# Patient Record
Sex: Female | Born: 1965 | Race: White | Hispanic: No | Marital: Married | Smoking: Former smoker
Health system: Southern US, Community
[De-identification: ages and names within clinical notes are randomized; demographics above are authoritative.]

## PROBLEM LIST (undated history)

## (undated) DIAGNOSIS — M26651 Arthropathy of right temporomandibular joint: Secondary | ICD-10-CM

## (undated) DIAGNOSIS — N809 Endometriosis, unspecified: Secondary | ICD-10-CM

## (undated) DIAGNOSIS — F5102 Adjustment insomnia: Secondary | ICD-10-CM

## (undated) DIAGNOSIS — G250 Essential tremor: Secondary | ICD-10-CM

## (undated) DIAGNOSIS — R079 Chest pain, unspecified: Secondary | ICD-10-CM

## (undated) DIAGNOSIS — K219 Gastro-esophageal reflux disease without esophagitis: Secondary | ICD-10-CM

## (undated) DIAGNOSIS — F32A Depression, unspecified: Secondary | ICD-10-CM

## (undated) DIAGNOSIS — R41840 Attention and concentration deficit: Secondary | ICD-10-CM

## (undated) DIAGNOSIS — R0789 Other chest pain: Secondary | ICD-10-CM

## (undated) DIAGNOSIS — E079 Disorder of thyroid, unspecified: Secondary | ICD-10-CM

## (undated) DIAGNOSIS — E28319 Asymptomatic premature menopause: Secondary | ICD-10-CM

## (undated) DIAGNOSIS — E049 Nontoxic goiter, unspecified: Secondary | ICD-10-CM

## (undated) DIAGNOSIS — B001 Herpesviral vesicular dermatitis: Secondary | ICD-10-CM

## (undated) DIAGNOSIS — K5790 Diverticulosis of intestine, part unspecified, without perforation or abscess without bleeding: Secondary | ICD-10-CM

## (undated) DIAGNOSIS — M1990 Unspecified osteoarthritis, unspecified site: Secondary | ICD-10-CM

## (undated) DIAGNOSIS — F329 Major depressive disorder, single episode, unspecified: Secondary | ICD-10-CM

## (undated) HISTORY — DX: Essential tremor: G25.0

## (undated) HISTORY — DX: Disorder of thyroid, unspecified: E07.9

## (undated) HISTORY — DX: Nontoxic goiter, unspecified: E04.9

## (undated) HISTORY — DX: Major depressive disorder, single episode, unspecified: F32.9

## (undated) HISTORY — DX: Endometriosis, unspecified: N80.9

## (undated) HISTORY — DX: Depression, unspecified: F32.A

## (undated) HISTORY — DX: Chest pain, unspecified: R07.9

## (undated) HISTORY — DX: Arthropathy of right temporomandibular joint: M26.651

## (undated) HISTORY — DX: Asymptomatic premature menopause: E28.319

## (undated) HISTORY — DX: Unspecified osteoarthritis, unspecified site: M19.90

## (undated) HISTORY — DX: Diverticulosis of intestine, part unspecified, without perforation or abscess without bleeding: K57.90

## (undated) HISTORY — PX: OTHER SURGICAL HISTORY: SHX169

## (undated) HISTORY — PX: PELVIC LAPAROSCOPY: SHX162

## (undated) HISTORY — PX: TONSILLECTOMY AND ADENOIDECTOMY: SUR1326

## (undated) HISTORY — DX: Other chest pain: R07.89

## (undated) HISTORY — DX: Attention and concentration deficit: R41.840

## (undated) HISTORY — DX: Adjustment insomnia: F51.02

## (undated) HISTORY — DX: Herpesviral vesicular dermatitis: B00.1

## (undated) HISTORY — DX: Gastro-esophageal reflux disease without esophagitis: K21.9

---

## 1999-10-19 ENCOUNTER — Other Ambulatory Visit: Admission: RE | Admit: 1999-10-19 | Discharge: 1999-10-19 | Payer: Self-pay | Admitting: Gynecology

## 2001-03-26 ENCOUNTER — Other Ambulatory Visit: Admission: RE | Admit: 2001-03-26 | Discharge: 2001-03-26 | Payer: Self-pay | Admitting: Gynecology

## 2001-04-15 ENCOUNTER — Ambulatory Visit (HOSPITAL_COMMUNITY): Admission: RE | Admit: 2001-04-15 | Discharge: 2001-04-15 | Payer: Self-pay | Admitting: Gynecology

## 2001-04-15 ENCOUNTER — Encounter (INDEPENDENT_AMBULATORY_CARE_PROVIDER_SITE_OTHER): Payer: Self-pay | Admitting: Specialist

## 2003-01-24 ENCOUNTER — Inpatient Hospital Stay (HOSPITAL_COMMUNITY): Admission: RE | Admit: 2003-01-24 | Discharge: 2003-01-24 | Payer: Self-pay | Admitting: *Deleted

## 2004-02-15 ENCOUNTER — Other Ambulatory Visit: Admission: RE | Admit: 2004-02-15 | Discharge: 2004-02-15 | Payer: Self-pay | Admitting: Gynecology

## 2005-05-02 ENCOUNTER — Other Ambulatory Visit: Admission: RE | Admit: 2005-05-02 | Discharge: 2005-05-02 | Payer: Self-pay | Admitting: Gynecology

## 2006-07-12 ENCOUNTER — Other Ambulatory Visit: Admission: RE | Admit: 2006-07-12 | Discharge: 2006-07-12 | Payer: Self-pay | Admitting: Gynecology

## 2007-08-12 ENCOUNTER — Other Ambulatory Visit: Admission: RE | Admit: 2007-08-12 | Discharge: 2007-08-12 | Payer: Self-pay | Admitting: Gynecology

## 2008-08-21 ENCOUNTER — Other Ambulatory Visit: Admission: RE | Admit: 2008-08-21 | Discharge: 2008-08-21 | Payer: Self-pay | Admitting: Gynecology

## 2008-08-21 ENCOUNTER — Ambulatory Visit: Payer: Self-pay | Admitting: Gynecology

## 2008-08-21 ENCOUNTER — Encounter: Payer: Self-pay | Admitting: Gynecology

## 2008-08-26 ENCOUNTER — Ambulatory Visit: Payer: Self-pay | Admitting: Gynecology

## 2009-04-26 ENCOUNTER — Ambulatory Visit: Payer: Self-pay | Admitting: Gynecology

## 2009-04-27 ENCOUNTER — Ambulatory Visit: Payer: Self-pay | Admitting: Gynecology

## 2009-09-15 ENCOUNTER — Encounter: Payer: Self-pay | Admitting: Gynecology

## 2009-09-15 ENCOUNTER — Other Ambulatory Visit: Admission: RE | Admit: 2009-09-15 | Discharge: 2009-09-15 | Payer: Self-pay | Admitting: Gynecology

## 2009-09-15 ENCOUNTER — Ambulatory Visit: Payer: Self-pay | Admitting: Gynecology

## 2010-07-04 HISTORY — PX: CHOLECYSTECTOMY: SHX55

## 2010-12-09 ENCOUNTER — Other Ambulatory Visit
Admission: RE | Admit: 2010-12-09 | Discharge: 2010-12-09 | Payer: Self-pay | Source: Home / Self Care | Admitting: Gynecology

## 2010-12-09 ENCOUNTER — Ambulatory Visit
Admission: RE | Admit: 2010-12-09 | Discharge: 2010-12-09 | Payer: Self-pay | Source: Home / Self Care | Attending: Gynecology | Admitting: Gynecology

## 2011-04-05 ENCOUNTER — Other Ambulatory Visit: Payer: Commercial Indemnity

## 2011-04-05 ENCOUNTER — Ambulatory Visit (INDEPENDENT_AMBULATORY_CARE_PROVIDER_SITE_OTHER): Payer: Commercial Indemnity | Admitting: Gynecology

## 2011-04-05 ENCOUNTER — Other Ambulatory Visit: Payer: Self-pay | Admitting: Gynecology

## 2011-04-05 DIAGNOSIS — N882 Stricture and stenosis of cervix uteri: Secondary | ICD-10-CM

## 2011-04-05 DIAGNOSIS — D259 Leiomyoma of uterus, unspecified: Secondary | ICD-10-CM

## 2011-04-05 DIAGNOSIS — N950 Postmenopausal bleeding: Secondary | ICD-10-CM

## 2011-04-21 NOTE — Op Note (Signed)
[facility]  Patient:    Michele Roberson, Michele Roberson                  MRN: [id number] Proc. Date: 04/15/01 Adm. Date:  [id number] Attending:  Merrily Pew                           Operative Report  PREOPERATIVE DIAGNOSIS:       Endometriosis.  POSTOPERATIVE DIAGNOSIS:      Endometriosis.  PROCEDURES:                   1. Laparoscopic biopsy and fulguration of                                  endometriosis.                               2. Left ovarian endometrioma excision and                                  fulguration.                               3. Chromopertubation.  SURGEON:                      Timothy P. Fontaine, M.D.  ANESTHESIA:                   General endotracheal.  COMPLICATIONS:                None.  ESTIMATED BLOOD LOSS:         Minimal.  SPECIMENS:                    1. Peritoneal biopsy.                               2. Ovarian endometrioma cyst wall.  FINDINGS:                     The anterior cul-de-sac with superficial brown endometriosis.  A representative area was biopsied and bipolar fulgurated. The posterior cul-de-sac was grossly normal with changes consistent with her past history of LUNA.  Uterus normal size.  Right and left fallopian tubes normal length, caliber and normal fimbriated ends.  Positive fill and spill. The right ovary was grossly normal, free and mobile.  The left ovary with puckered cystic area upon entry with chocolate cyst material.  Representative cyst wall biopsy taken and the remainder fulgurated.  Left pelvic side wall fibrotic area underlying the ovary above ureter and vascular structures bipolar cauterized.  No other areas of active-appearing endometriosis. Appendix grossly normal.  Liver smooth and grossly normal.  Gallbladder not visualized.  DESCRIPTION OF PROCEDURE:     The patient was taken to the operating room and underwent general endotracheal anesthesia, received an  abdominal, peritoneal and vaginal preparation with Betadine scrub and Betadine solution.  The bladder was emptied with in-and-out Foley catheterization.  Examination under anesthesia was performed.  A Hulka tenaculum was placed in the cervix.  The patient was draped  in the usual fashion.  A transverse infraumbilical incision was made through the prior scar.  A Veress needle was placed and its position verified with water.  Approximately 2 L of carbon dioxide gas was infused without difficulty.  A 10 mm laparoscopic trocar was then placed without difficulty and its position verified visually.  A midline 5 mm suprapubic port was placed under direct visualization after transillumination for the vessels. Initial examination of the pelvic organs and upper abdominal exam were then carried out with findings as noted above.  A left of midline 5 mm port was subsequently placed in a similar fashion as the first port.  The anterior peritoneal cul-de-sac endometriosis was grasped with a forceps, the peritoneum tented and a representative biopsy taken.  The area was then superficially bipolar cauterized in a brushing technique.  The left ovary was then elevated and stabilized.  Sharp incision into a cystic area produced a chocolate-brown endometriotic cyst fluid.  This was copiously irrigated from the pelvis, as was the cyst, which was approximately 2 cm.  Subsequently, the cyst wall was sharply incised.  Initial attempts at stripping the cyst from the ovarian stroma were unsuccessful and a representative area was excised.  Subsequently, with bipolar cauterization, the remaining endometriotic cyst wall was fulgurated.  An underlying fibrotic area in the left pelvic side wall was identified and, although it was difficult to tell whether this was active endometriosis or not, this area was bipolar fulgurated, taking care to identify the ureter and vessels far from the site.  The pelvis was  copiously irrigated.  The Hulka tenaculum was replaced with a Cohen cannula and chromopertubation was performed, noting free fill and spill of dye from both tubes.  The left ovary was then elevated and INTERCEED was placed over the area of the endometrioma without difficulty.  The suprapubic ports were then removed and the gas allowed to escape.  The areas were inspected under low pressure situation to show adequate hemostasis.  The infraumbilical port was back out under direct visualization, showing adequate hemostasis and no evidence of hernia formation.  All skin incision sites were injected using 0.25% Marcaine subcutaneously.  The infraumbilical port was closed using 0 Vicryl suture in an interrupted subcutaneous fascial stitch.  All skin incisions were then closed using 4-0 Vicryl in interrupted subcuticular stitch.  Sterile dressings were applied.  The tenaculum was removed from the cervix.  The patient was placed in the supine position, awakened without difficulty and taken to the recovery room in good condition, having tolerated the procedure well. DD:  04/15/01 TD:  04/15/01 Job: [id number]

## 2011-04-21 NOTE — H&P (Signed)
[facility]  Patient:    Michele Roberson, Michele Roberson                    MRN: [id number] Adm. Date:  04/15/01 Attending:  Marcial Pacas P. Fontaine, M.D.                         History and Physical  CHIEF COMPLAINT:              Increasing dysmenorrhea, pelvic pain, dyspareunia, history of endometriosis.  HISTORY OF PRESENT ILLNESS:   This is a 45 year old G0, P0 female who presents with a history of having undergone laparoscopy in 1996, at which time endometriosis of the pelvic sidewall, left ovary, anterior peritoneum was found.  She underwent laser vaporization and a lunar procedure at that time, and she relates that the pain that she was having preoperatively improved postoperatively.  The patient notes now that her pain has reappeared over the past year and she is now having worsening dysmenorrhea, pelvic pain, and dyspareunia similar to the pain she was having prior to her last laparoscopy. She has also been attempting pregnancy and has been unsuccessful, despite undergoing evaluation by Dr. Jacky Kindle to include a Clomid/Pergonal cycle with IUI x 2 cycles.  The patient is admitted at this time for laser video laparoscopy _________ per tubation.  PAST MEDICAL HISTORY:         Uncomplicated.  PAST SURGICAL HISTORY:        Tonsillectomy and adenoidectomy, left wrist cyst, and laparoscopy.  ALLERGIES:                    E-MYCIN.  FAMILY HISTORY:               Noncontributory.  SOCIAL HISTORY:               Noncontributory without alcohol or cigarette use.  REVIEW OF SYSTEMS:            Noncontributory.  ADMISSION PHYSICAL EXAMINATION:  HEENT:                        Normal.  LUNGS:                        Clear.  CARDIAC:                      Regular rate without rubs, murmurs, or gallops.  ABDOMEN:                      Benign.  PELVIC:                       External BUS, vagina normal.  Cervix normal. Uterus normal size, nontender.  Adnexae  without masses or tenderness.  ASSESSMENT:                   A 45 year old G0, P0 female, history of endometriosis, status post laser vaporization and lunar procedure in 1996 with recurrence of her symptoms.  The patient also has primary infertility which she has been evaluated for and treated with two cycles of Clomid/Pergonal and IUI without success.  The patient is admitted at this time for relaparoscopy _________ per tubation.  I again reviewed with the patient what is involved to include instrumentation, trocar placement, insufflation, use of sharp/blunt dissection, electrocautery, and  laser.  The risks of the procedure were reviewed with the patient to include the risk of inadvertent injury to internal organs including bowel, bladder, ureters, vessels, and nerves, necessitating major exploratory reparative surgeries and future reparative surgeries including ostomy formation.  The risks of hemorrhage, necessitating transfusion and the risks of transfusion were discussed, including the risks of transfusion reaction, hepatitis, AIDs, mad cow disease, and other unknown entities.  The risks of infection, both incisional, requiring opening and draining of incisions, as well as internal, requiring prolonged antibiotics, were all discussed, understood, and accepted.  Given her history of infertility, I think that we should also do a _________ per tubation at that time and I have discussed this with her and she agrees with the procedure. Lastly, no guarantees as far as pain relief, nor infertility were made.  The patient understands that she may have persistent pain, worsening pain, changing pain following the procedure and that she may still be unable to conceive following the procedure, and she understands and accepts this.  The patients questions were answered to her satisfaction and she is ready to proceed with surgery. DD:  04/11/01 TD:  04/11/01 Job: [id number]

## 2012-03-20 ENCOUNTER — Encounter: Payer: Self-pay | Admitting: *Deleted

## 2012-03-20 DIAGNOSIS — E28319 Asymptomatic premature menopause: Secondary | ICD-10-CM | POA: Insufficient documentation

## 2012-03-21 ENCOUNTER — Ambulatory Visit (INDEPENDENT_AMBULATORY_CARE_PROVIDER_SITE_OTHER): Payer: Commercial Indemnity | Admitting: Gynecology

## 2012-03-21 ENCOUNTER — Encounter: Payer: Self-pay | Admitting: Gynecology

## 2012-03-21 VITALS — BP 100/60 | Ht 63.5 in | Wt 140.0 lb

## 2012-03-21 DIAGNOSIS — F329 Major depressive disorder, single episode, unspecified: Secondary | ICD-10-CM | POA: Insufficient documentation

## 2012-03-21 DIAGNOSIS — Z79890 Hormone replacement therapy (postmenopausal): Secondary | ICD-10-CM

## 2012-03-21 DIAGNOSIS — Z131 Encounter for screening for diabetes mellitus: Secondary | ICD-10-CM

## 2012-03-21 DIAGNOSIS — Z13220 Encounter for screening for lipoid disorders: Secondary | ICD-10-CM

## 2012-03-21 DIAGNOSIS — Z01419 Encounter for gynecological examination (general) (routine) without abnormal findings: Secondary | ICD-10-CM

## 2012-03-21 MED ORDER — ESTROPIPATE 1.5 MG PO TABS: 1.5000 mg | ORAL_TABLET | Freq: Every day | ORAL | Status: DC

## 2012-03-21 MED ORDER — PROGESTERONE MICRONIZED 100 MG PO CAPS: 100.0000 mg | ORAL_CAPSULE | Freq: Every day | ORAL | Status: DC

## 2012-03-21 NOTE — Patient Instructions (Signed)
Follow up in a year, sooner as needed.

## 2012-03-21 NOTE — Progress Notes (Signed)
Michele Roberson 03-15-1966 [id number]        46 y.o.  for annual exam.  Several issues noted below.  Past medical history,surgical history, medications, allergies, family history and social history were all reviewed and documented in the EPIC chart. ROS:  Was performed and pertinent positives and negatives are included in the history.  Exam: Amy chaperone present Filed Vitals:   03/21/12 1132  BP: 100/60   General appearance  Normal Skin grossly normal Head/Neck normal with no cervical or supraclavicular adenopathy thyroid normal Lungs  clear Cardiac RR, without RMG Abdominal  soft, nontender, without masses, organomegaly or hernia Breasts  examined lying and sitting without masses, retractions, discharge or axillary adenopathy. Pelvic  Ext/BUS/vagina  normal   Cervix  normal    Uterus  anteverted, normal size, shape and contour, midline and mobile nontender   Adnexa  Without masses or tenderness    Anus and perineum  normal   Rectovaginal  normal sphincter tone without palpated masses or tenderness.    Assessment/Plan:  46 y.o. female for annual exam.    1. Premature menopause. Patient had been on Estrace/Prometrium. She had discussed just not feeling as well with her primary physician who gave her a trial of bioidentical hormones. She did not feel any different with these and she wants to reinitiate regular HRT. She did ask about trying Ogen again which she had been on in the past and did well with this. We have discussed the HRT issue on multiple occasions to include the WHI study of stroke heart attack DVT breast cancer issues. Patient understands accepts these I refilled her HRT with Ogen 1.25 daily and Prometrium 100 mg nightly. She'll follow up with me if she has any issues with this. 2. Mammography. Patient is overdue for her mammogram and have reminded her to schedule this. SBE monthly reviewed. 3. Pap smear. She has no history of abnormal Pap smears with numerous normal  reports in the chart, last Pap smear 2012. No Pap smear was done today. We'll plan every 3 your Pap smear as per current screening guidelines. 4. Health maintenance. No blood work was done today. She sees her primary physician as well as endocrinologist and has routine blood work done through their office to include cholesterol glucose etc. I did check a urinalysis as historically she did not have this checked. Assuming she continues well from a gynecologic standpoint and she will see [place] in a year, sooner as needed.    Dara Lords MD, 12:17 PM 03/21/2012

## 2012-03-22 LAB — URINALYSIS W MICROSCOPIC + REFLEX CULTURE
Bacteria, UA: NONE SEEN
Bilirubin Urine: NEGATIVE
Casts: NONE SEEN
Crystals: NONE SEEN
Ketones, ur: NEGATIVE mg/dL
Specific Gravity, Urine: 1.013 (ref 1.005–1.030)
pH: 6 (ref 5.0–8.0)

## 2012-06-21 ENCOUNTER — Other Ambulatory Visit: Payer: Self-pay | Admitting: Gynecology

## 2012-12-26 ENCOUNTER — Telehealth: Payer: Self-pay | Admitting: *Deleted

## 2012-12-26 NOTE — Telephone Encounter (Signed)
Pt called c/o irregular bleeding, she states last cycle was a couple of years ago. Prior to on  Dec. 20th she had a full cycle, then stopped had spotting. Then another cycle started back on Monday regular flow, she still taking her progesterone as directed doesn't miss any days. Please advise

## 2012-12-26 NOTE — Telephone Encounter (Signed)
Patient needs evaluation. Recommend sonohysterogram and office visit to start.

## 2012-12-26 NOTE — Telephone Encounter (Signed)
Pt informed with the below note. Transferred to appointment desk.

## 2013-01-01 ENCOUNTER — Other Ambulatory Visit: Payer: Self-pay | Admitting: Gynecology

## 2013-01-01 DIAGNOSIS — N950 Postmenopausal bleeding: Secondary | ICD-10-CM

## 2013-01-06 ENCOUNTER — Ambulatory Visit (INDEPENDENT_AMBULATORY_CARE_PROVIDER_SITE_OTHER): Payer: Commercial Indemnity | Admitting: Gynecology

## 2013-01-06 ENCOUNTER — Encounter: Payer: Self-pay | Admitting: Gynecology

## 2013-01-06 ENCOUNTER — Other Ambulatory Visit: Payer: Self-pay | Admitting: Gynecology

## 2013-01-06 ENCOUNTER — Ambulatory Visit (INDEPENDENT_AMBULATORY_CARE_PROVIDER_SITE_OTHER): Payer: Commercial Indemnity

## 2013-01-06 DIAGNOSIS — N950 Postmenopausal bleeding: Secondary | ICD-10-CM

## 2013-01-06 DIAGNOSIS — N800 Endometriosis of uterus: Secondary | ICD-10-CM

## 2013-01-06 DIAGNOSIS — N83339 Acquired atrophy of ovary and fallopian tube, unspecified side: Secondary | ICD-10-CM

## 2013-01-06 DIAGNOSIS — Z79890 Hormone replacement therapy (postmenopausal): Secondary | ICD-10-CM

## 2013-01-06 DIAGNOSIS — N926 Irregular menstruation, unspecified: Secondary | ICD-10-CM

## 2013-01-06 DIAGNOSIS — N8300 Follicular cyst of ovary, unspecified side: Secondary | ICD-10-CM

## 2013-01-06 NOTE — Progress Notes (Signed)
Patient presents for sonohysterogram due to history of spontaneous vaginal bleeding several episodes over the past several months. She is on HRT of Ogen 1.5 mg and Prometrium 100 mg daily. She has a history of premature menopause. Notes that she feels almost premenopausal when she would have this bleeding episode. Patient also notes dyspareunia with pain upon entry. Thinks this is due to low estrogen and vaginal atrophic changes. We have discussed this in the past.  Exam external BUS vagina normal noting vaginismus type pattern with tightening on exam causing pain. Cervix grossly normal. Uterus normal size midline mobile nontender. Adnexa without masses or tenderness.  Ultrasound shows uterus normal size with some cortical cystic areas in the myometrium. Endometrial echo 3.5 mm. Right and left ovaries visualized and atrophic. Cul-de-sac negative.  sonohysterogram performed, sterile technique, easy catheter introduction, good distention with no abnormalities. Endometrial sample taken. Procedure did cause patient a lot of discomfort which she tolerated well.  Assessment and plan: 1. Premature menopause with sporadic bleeding of the last several months on HRT. Gives moliminal type symptoms before. We'll check baseline labs to include FSH TSH and prolactin. Continue on HRT for now and follow up for biopsy results. I suspect these will return atrophic or insufficient which is appropriate given the thinness on her ultrasound. 2. Vaginismus type pattern. I reviewed strategies to include self dilatation to achieve comfort and then allowing her husband to be involved but not coitally but only for placement until she feels comfortable with proceeding. Using graded dilators reviewed. She does want to try estrogen boost to the vagina the see if this doesn't help I gave her 6 week sample of Vagifem 10 mcg*to use it twice weekly to see if this doesn't help.patient will follow up for her labs and biopsy results and then  ultimately call me in 4-6 weeks in follow up of the Vagifem trial.

## 2013-01-06 NOTE — Patient Instructions (Signed)
Office will call you with biopsy results and lab results

## 2013-01-10 ENCOUNTER — Encounter: Payer: Self-pay | Admitting: Gynecology

## 2013-01-16 ENCOUNTER — Encounter: Payer: Self-pay | Admitting: Gynecology

## 2013-01-18 ENCOUNTER — Other Ambulatory Visit: Payer: Self-pay

## 2013-01-20 ENCOUNTER — Telehealth: Payer: Self-pay | Admitting: *Deleted

## 2013-01-20 DIAGNOSIS — N926 Irregular menstruation, unspecified: Secondary | ICD-10-CM

## 2013-01-20 NOTE — Telephone Encounter (Signed)
I called pt to follow up with email regarding her not being able to see Lab results. Pts labs went to [facility]. So pt will not be able to see them on mychart. I printed them and mailed them but upon doing that I noticed that the [facility] wasn't ran. I called [facility] and they said it was data entry error, order was on requistion but not ran. Pt informed and will come to get that drawn. Order placed [employer]

## 2013-01-20 NOTE — Addendum Note (Signed)
Addended by: Richardson Chiquito on: 01/20/2013 03:00 PM   Modules accepted: Orders

## 2013-10-09 ENCOUNTER — Other Ambulatory Visit: Payer: Self-pay

## 2014-09-18 ENCOUNTER — Other Ambulatory Visit: Payer: Self-pay

## 2017-09-18 DIAGNOSIS — R0781 Pleurodynia: Secondary | ICD-10-CM | POA: Insufficient documentation

## 2017-09-18 DIAGNOSIS — R079 Chest pain, unspecified: Secondary | ICD-10-CM

## 2017-09-18 DIAGNOSIS — K5790 Diverticulosis of intestine, part unspecified, without perforation or abscess without bleeding: Secondary | ICD-10-CM

## 2017-09-18 DIAGNOSIS — M1990 Unspecified osteoarthritis, unspecified site: Secondary | ICD-10-CM

## 2017-09-18 DIAGNOSIS — E079 Disorder of thyroid, unspecified: Secondary | ICD-10-CM | POA: Insufficient documentation

## 2017-09-18 DIAGNOSIS — N809 Endometriosis, unspecified: Secondary | ICD-10-CM | POA: Insufficient documentation

## 2017-09-18 HISTORY — DX: Chest pain, unspecified: R07.9

## 2017-09-18 HISTORY — DX: Diverticulosis of intestine, part unspecified, without perforation or abscess without bleeding: K57.90

## 2017-09-18 HISTORY — DX: Unspecified osteoarthritis, unspecified site: M19.90

## 2017-09-18 NOTE — Progress Notes (Signed)
Cardiology Office Note:    Date:  09/19/2017   ID:  Michele Roberson, DOB [birth year], MRN [id number]  PCP:  Ernestene Kiel, MD  Cardiologist:  Shirlee More, MD   Referring MD: Ernestene Kiel, MD  ASSESSMENT:    1. Pleuritic chest pain   2. SOB (shortness of breath) on exertion   3. Costochondral chest pain    PLAN:    In order of problems listed above:  1. Chest pain is most consistent with costochondral etiology, I've asked her to have a stress echo to exclude CAD with an abnormal EKG and use prescription strength nonsteroidal anti-inflammatory drug and if unimproved PT modalities. 2. Mild non limiting, check d-dimer if normal I would not pursue further evaluation for venous thromboembolism as she has not taken hormone replacement therapy for several years. 3. Prescription strength nonsteroidal anti-inflammatory drug reassess in several weeks for referral to physical therapy  Next appointment in 2 weeks  Medication Adjustments/Labs and Tests Ordered: Current medicines are reviewed at length with the patient today.  Concerns regarding medicines are outlined above.  Orders Placed This Encounter  Procedures  . CT ANGIO CHEST PE W OR WO CONTRAST  . Sedimentation rate  . ECHOCARDIOGRAM STRESS TEST   Meds ordered this encounter  Medications  . celecoxib (CELEBREX) 200 MG capsule    Sig: Take 1 capsule (200 mg total) by mouth 2 (two) times daily.    Dispense:  60 capsule    Refill:  1     Chief Complaint  Patient presents with  . Follow-up  . Chest Pain  . Shortness of Breath    History of Present Illness:    Michele Roberson is a 51 y.o. female  On hormone replacement therapy who is being seen today for the evaluation of chest pain and an abnormal EKG at the request of Ernestene Kiel, MD. She was seen at [facility] ED 09/13/17 with pleuritic chest pain since pneumonia in August, CXR, EKG, CBC and Troponin were all normal. She was told that her EKG was  abnormal when seen at Urgent care earlier that day. She has been off hormone replacement therapy for several years and has no history of venous thromboembolism. 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 She has been using a nonsteroidal anti-inflammatory drug without improvement..She has mild exertional shortness of breath walking a long distance but no severe or paroxysmal episodes. She relates that hr first EKG recently was abnormal and was encouraged to see a cardiologist by the physician at urgent care.  Past Medical History:  Diagnosis Date  . Arthritis 09/18/2017  . Chest pain 09/18/2017  . Depression   . Diverticulosis 09/18/2017  . Endometriosis   . Endometriosis   . Premature menopause   . Thyroid disease     Past Surgical History:  Procedure Laterality Date  . CHOLECYSTECTOMY  07/2010  . PELVIC LAPAROSCOPY     x2  . TONSILLECTOMY AND ADENOIDECTOMY    . wrist cyst removal      Current Medications: Current Meds  Medication Sig  . buPROPion (WELLBUTRIN XL) 300 MG 24 hr tablet Take 300 mg by mouth daily.  Marland Kitchen escitalopram (LEXAPRO) 10 MG tablet Take 10 mg by mouth daily.  Marland Kitchen L-Methylfolate (DEPLIN PO) Take by mouth.  Marland Kitchen omeprazole (PRILOSEC) 20 MG capsule Take by mouth daily.  Marland Kitchen  temazepam (RESTORIL) 15 MG capsule Take 15 mg by mouth at bedtime as needed.  . traZODone (DESYREL) 100 MG tablet Take 100 mg by mouth at bedtime.     Allergies:   Erythromycin   Social History   Social History  . Marital status: Married    Spouse name: N/A  . Number of children: N/A  . Years of education: N/A   Social History Main Topics  . Smoking status: Former Research scientist (life sciences)  . Smokeless tobacco: Never Used  . Alcohol use No  . Drug use: No  . Sexual activity: Yes    Partners: Male    Birth control/ protection:  Post-menopausal   Other Topics Concern  . None   Social History Narrative  . None     Family History: The patient's family history includes Diabetes in her father; Heart disease in her father; Hypertension in her father; Uterine cancer in her paternal grandmother.  ROS:   ROS Please see the history of present illness.     All other systems reviewed and are negative.  EKGs/Labs/Other Studies Reviewed:    The following studies were reviewed today:   EKG:  EKG is  ordered today.  The ekg ordered today demonstrates sinus  Sinus rhythm and nonspecificT wave  Recent Labs: No results found for requested labs within last 8760 hours.  Recent Lipid Panel No results found for: CHOL, TRIG, HDL, CHOLHDL, VLDL, LDLCALC, LDLDIRECT  Physical Exam:    VS:  BP 114/74 (BP Location: Right Arm, Patient Position: Sitting, Cuff Size: Normal)   Ht 5' 3.5" (1.613 m)   Wt 160 lb (72.6 kg)   SpO2 97%   BMI 27.90 kg/m     Wt Readings from Last 3 Encounters:  09/19/17 160 lb (72.6 kg)  03/21/12 140 lb (63.5 kg)     GEN:  Well nourished, well developed in no acute distress HEENT: Normal NECK: No JVD; No carotid bruits LYMPHATICS: No lymphadenopathy CARDIAC: tenderness along the left costochondral junction reproducing her complaint RRR, no murmurs, rubs, gallops RESPIRATORY:  Clear to auscultation without rales, wheezing or rhonchi  ABDOMEN: Soft, non-tender, non-distended MUSCULOSKELETAL:  No edema; No deformity  SKIN: Warm and dry NEUROLOGIC:  Alert and oriented x 3 PSYCHIATRIC:  Normal affect     Signed, Shirlee More, MD  09/19/2017 1:12 PM    [facility]

## 2017-09-19 ENCOUNTER — Encounter: Payer: Self-pay | Admitting: Cardiology

## 2017-09-19 ENCOUNTER — Ambulatory Visit (INDEPENDENT_AMBULATORY_CARE_PROVIDER_SITE_OTHER): Payer: Managed Care, Other (non HMO) | Admitting: Cardiology

## 2017-09-19 VITALS — BP 114/74 | Ht 63.5 in | Wt 160.0 lb

## 2017-09-19 DIAGNOSIS — R071 Chest pain on breathing: Secondary | ICD-10-CM | POA: Diagnosis not present

## 2017-09-19 DIAGNOSIS — R0789 Other chest pain: Secondary | ICD-10-CM | POA: Insufficient documentation

## 2017-09-19 DIAGNOSIS — R0602 Shortness of breath: Secondary | ICD-10-CM | POA: Diagnosis not present

## 2017-09-19 DIAGNOSIS — R0781 Pleurodynia: Secondary | ICD-10-CM | POA: Diagnosis not present

## 2017-09-19 MED ORDER — CELECOXIB 200 MG PO CAPS: 200.0000 mg | ORAL_CAPSULE | Freq: Two times a day (BID) | ORAL | 1 refills | Status: DC

## 2017-09-19 NOTE — Patient Instructions (Addendum)
Medication Instructions:  Your physician has recommended you make the following change in your medication:  STOP naproxen START celecoxib (Celebrex) 200 mg twice daily  Labwork: Your physician recommends that you return for lab work today. Sed rate  Testing/Procedures: Non-Cardiac CT Angiography (CTA), is a special type of CT scan that uses a computer to produce multi-dimensional views of major blood vessels throughout the body. In CT angiography, a contrast material is injected through an IV to help visualize the blood vessels  Your physician has requested that you have a stress echocardiogram. For further information please visit [email or web address]. Please follow instruction sheet as given.  Follow-Up: Your physician recommends that you schedule a follow-up appointment in: 2 weeks.  Any Other Special Instructions Will Be Listed Below (If Applicable).     If you need a refill on your cardiac medications before your next appointment, please call your pharmacy.    Costochondritis Costochondritis is swelling and irritation (inflammation) of the tissue (cartilage) that connects your ribs to your breastbone (sternum). This causes pain in the front of your chest. Usually, the pain:  Starts gradually.  Is in more than one rib.  This condition usually goes away on its own over time. Follow these instructions at home:  Do not do anything that makes your pain worse.  If directed, put ice on the painful area: ? Put ice in a plastic bag. ? Place a towel between your skin and the bag. ? Leave the ice on for 20 minutes, 2-3 times a day.  If directed, put heat on the affected area as often as told by your doctor. Use the heat source that your doctor tells you to use, such as a moist heat pack or a heating pad. ? Place a towel between your skin and the heat source. ? Leave the heat on for 20-30 minutes. ? Take off the heat if your skin turns bright red. This is very important if you  cannot feel pain, heat, or cold. You may have a greater risk of getting burned.  Take over-the-counter and prescription medicines only as told by your doctor.  Return to your normal activities as told by your doctor. Ask your doctor what activities are safe for you.  Keep all follow-up visits as told by your doctor. This is important. Contact a doctor if:  You have chills or a fever.  Your pain does not go away or it gets worse.  You have a cough that does not go away. Get help right away if:  You are short of breath. This information is not intended to replace advice given to you by your health care provider. Make sure you discuss any questions you have with your health care provider. Document Released: 05/08/2008 Document Revised: 06/09/2016 Document Reviewed: 03/15/2016 Elsevier Interactive Patient Education  [employer].

## 2017-09-20 LAB — SEDIMENTATION RATE: SED RATE: 3 mm/h (ref 0–40)

## 2017-09-20 NOTE — Addendum Note (Signed)
Addended by: Jossie Ng on: 09/20/2017 08:36 AM   Modules accepted: Orders

## 2017-09-25 ENCOUNTER — Ambulatory Visit (HOSPITAL_BASED_OUTPATIENT_CLINIC_OR_DEPARTMENT_OTHER)
Admission: RE | Admit: 2017-09-25 | Discharge: 2017-09-25 | Disposition: A | Discharge status: Home / Self Care | Payer: Managed Care, Other (non HMO) | Source: Ambulatory Visit | Attending: Cardiology | Admitting: Cardiology

## 2017-09-25 ENCOUNTER — Encounter (HOSPITAL_BASED_OUTPATIENT_CLINIC_OR_DEPARTMENT_OTHER): Payer: Self-pay

## 2017-09-25 DIAGNOSIS — Z8781 Personal history of (healed) traumatic fracture: Secondary | ICD-10-CM | POA: Insufficient documentation

## 2017-09-25 DIAGNOSIS — R0781 Pleurodynia: Secondary | ICD-10-CM

## 2017-09-25 MED ORDER — IOPAMIDOL (ISOVUE-370) INJECTION 76%
100.0000 mL | Freq: Once | INTRAVENOUS | Status: AC | PRN
  Administered 2017-09-25: 100 mL via INTRAVENOUS

## 2017-09-25 NOTE — Progress Notes (Signed)
  Echocardiogram Echocardiogram Stress Test has been performed.  Tresa Res 09/25/2017, 11:53 AM

## 2017-10-02 NOTE — Progress Notes (Deleted)
Cardiology Office Note:    Date:  10/02/2017   ID:  REA Michele Roberson, DOB [birth year], MRN [id number]  PCP:  Ernestene Kiel, MD  Cardiologist:  Shirlee More, MD    Referring MD: Ernestene Kiel, MD    ASSESSMENT:    No diagnosis found. PLAN:    In order of problems listed above:  1. ***   Next appointment: ***   Medication Adjustments/Labs and Tests Ordered: Current medicines are reviewed at length with the patient today.  Concerns regarding medicines are outlined above.  No orders of the defined types were placed in this encounter.  No orders of the defined types were placed in this encounter.   No chief complaint on file.   History of Present Illness:    Michele Roberson is a 51 y.o. female with a hx of chest pain last seen 2 weeks ago. Compliance with diet, lifestyle and medications: *** Past Medical History:  Diagnosis Date  . Arthritis 09/18/2017  . Chest pain 09/18/2017  . Depression   . Diverticulosis 09/18/2017  . Endometriosis   . Endometriosis   . Premature menopause   . Thyroid disease     Past Surgical History:  Procedure Laterality Date  . CHOLECYSTECTOMY  07/2010  . PELVIC LAPAROSCOPY     x2  . TONSILLECTOMY AND ADENOIDECTOMY    . wrist cyst removal      Current Medications: No outpatient prescriptions have been marked as taking for the 10/03/17 encounter (Appointment) with Richardo Priest, MD.     Allergies:   Erythromycin   Social History   Social History  . Marital status: Married    Spouse name: N/A  . Number of children: N/A  . Years of education: N/A   Social History Main Topics  . Smoking status: Former Research scientist (life sciences)  . Smokeless tobacco: Never Used  . Alcohol use No  . Drug use: No  . Sexual activity: Yes    Partners: Male    Birth control/ protection: Post-menopausal   Other Topics Concern  . Not on file   Social History Narrative  . No narrative on file     Family History: The patient's ***family  history includes Diabetes in her father; Heart disease in her father; Hypertension in her father; Uterine cancer in her paternal grandmother. ROS:   Please see the history of present illness.    All other systems reviewed and are negative.  EKGs/Labs/Other Studies Reviewed:    The following studies were reviewed today:  Stress echo: Study Conclusions - Stress ECG conclusions: There were no stress arrhythmias or   conduction abnormalities. The stress ECG was negative for   ischemia. The stress ECG was non-diagnostic. - Staged echo: There was no echocardiographic evidence for   stress-induced ischemia. Impressions: - Good exercise tolerance.   Atypical chest pain during the exercise ( worst with deep breath,   2/10).   NO evidence of ischemia base on ECG criteria.   Normal Echo at rest and after exercise.   Overall it is NEGATIVE stress echo for exercise induced   myocardial ischemia.  CTA chest:IMPRESSION: 1. No evidence of acute pulmonary embolism. 2. No acute abnormality of the thoracic aorta is seen. 3. No pneumonia or pleural effusion. 4. Healed fractures of the right fifth and bilateral anterior sixth ribs.  Recent Labs: sed rate 3  No results found for requested labs within last 8760 hours.  Recent Lipid Panel No results found for: CHOL, TRIG, HDL, CHOLHDL, VLDL, LDLCALC, LDLDIRECT  Physical Exam:    VS:  There were no vitals taken for this visit.    Wt Readings from Last 3 Encounters:  09/19/17 160 lb (72.6 kg)  03/21/12 140 lb (63.5 kg)     GEN: *** Well nourished, well developed in no acute distress HEENT: Normal NECK: No JVD; No carotid bruits LYMPHATICS: No lymphadenopathy CARDIAC: ***RRR, no murmurs, rubs, gallops RESPIRATORY:  Clear to auscultation without rales, wheezing or rhonchi  ABDOMEN: Soft, non-tender, non-distended MUSCULOSKELETAL:  No edema; No deformity  SKIN: Warm and dry NEUROLOGIC:  Alert and oriented x 3 PSYCHIATRIC:  Normal affect     Signed, Shirlee More, MD  10/02/2017 10:13 AM    [place]

## 2017-10-03 ENCOUNTER — Ambulatory Visit: Payer: Commercial Indemnity | Admitting: Cardiology

## 2017-11-01 NOTE — Progress Notes (Signed)
Cardiology Office Note:    Date:  11/01/2017   ID:  Michele Roberson, DOB [birth year], MRN [id number]  PCP:  Ernestene Kiel, MD  Cardiologist:  Shirlee More, MD    Referring MD: Ernestene Kiel, MD    ASSESSMENT:    1. Costochondral chest pain    PLAN:    In order of problems listed above:  1. Improved, she is having little or no chest wall pain only with sneezing.  Her echocardiogram was normal with no evidence of cardiomyopathy pericardial disease or cardiac ischemia.  I did ask her to discuss with her PCP the old rib fractures on a CT scan without trauma she said it may have occurred when she had pneumonia and paroxysms of cough in the past has a history of either osteopenia or osteoporosis and may require further evaluation or treatment.   Next appointment: As needed   Medication Adjustments/Labs and Tests Ordered: Current medicines are reviewed at length with the patient today.  Concerns regarding medicines are outlined above.  No orders of the defined types were placed in this encounter.  No orders of the defined types were placed in this encounter.   No chief complaint on file.   History of Present Illness:    Michele Roberson is a 51 y.o. female  with a hx of chest pain and an abnormal EKG  last seen 6 weeks ago.. Compliance with diet, lifestyle and medications: Yes She is relieved by having a normal stress test and has very little residual chest wall pain just with sneezing.  It is not severe or sustained. Past Medical History:  Diagnosis Date  . Arthritis 09/18/2017  . Chest pain 09/18/2017  . Depression   . Diverticulosis 09/18/2017  . Endometriosis   . Endometriosis   . Premature menopause   . Thyroid disease     Past Surgical History:  Procedure Laterality Date  . CHOLECYSTECTOMY  07/2010  . PELVIC LAPAROSCOPY     x2  . TONSILLECTOMY AND ADENOIDECTOMY    . wrist cyst removal      Current Medications: No outpatient medications  have been marked as taking for the 11/02/17 encounter (Appointment) with Richardo Priest, MD.     Allergies:   Erythromycin   Social History   Socioeconomic History  . Marital status: Married    Spouse name: Not on file  . Number of children: Not on file  . Years of education: Not on file  . Highest education level: Not on file  Social Needs  . Financial resource strain: Not on file  . Food insecurity - worry: Not on file  . Food insecurity - inability: Not on file  . Transportation needs - medical: Not on file  . Transportation needs - non-medical: Not on file  Occupational History  . Not on file  Tobacco Use  . Smoking status: Former Research scientist (life sciences)  . Smokeless tobacco: Never Used  Substance and Sexual Activity  . Alcohol use: No  . Drug use: No  . Sexual activity: Yes    Partners: Male    Birth control/protection: Post-menopausal  Other Topics Concern  . Not on file  Social History Narrative  . Not on file     Family History: The patient's family history includes Diabetes in her father; Heart disease in her father; Hypertension in her father; Uterine cancer in her paternal grandmother. ROS:   Please see the history of present illness.    All other systems reviewed and are negative.  EKGs/Labs/Other Studies Reviewed:    The following studies were reviewed today:  Stress echo:  - Good exercise tolerance.   Atypical chest pain during the exercise ( worst with deep breath,   2/10).   NO evidence of ischemia base on ECG criteria.   Normal Echo at rest and after exercise.   Overall it is NEGATIVE stress echo for exercise induced   myocardial ischemia. Recent Labs: No results found for requested labs within last 8760 hours.  Recent Lipid Panel No results found for: CHOL, TRIG, HDL, CHOLHDL, VLDL, LDLCALC, LDLDIRECT  Physical Exam:    VS:  There were no vitals taken for this visit.    Wt Readings from Last 3 Encounters:  09/19/17 160 lb (72.6 kg)  03/21/12 140 lb  (63.5 kg)     GEN:  Well nourished, well developed in no acute distress HEENT: Normal NECK: No JVD; No carotid bruits LYMPHATICS: No lymphadenopathy CARDIAC: RRR, no murmurs, rubs, gallops RESPIRATORY:  Clear to auscultation without rales, wheezing or rhonchi  ABDOMEN: Soft, non-tender, non-distended MUSCULOSKELETAL:  No edema; No deformity  SKIN: Warm and dry NEUROLOGIC:  Alert and oriented x 3 PSYCHIATRIC:  Normal affect    Signed, Shirlee More, MD  11/01/2017 2:05 PM    [place]

## 2017-11-02 ENCOUNTER — Ambulatory Visit: Payer: Managed Care, Other (non HMO) | Admitting: Cardiology

## 2017-11-02 ENCOUNTER — Ambulatory Visit (INDEPENDENT_AMBULATORY_CARE_PROVIDER_SITE_OTHER): Payer: Managed Care, Other (non HMO) | Admitting: Cardiology

## 2017-11-02 ENCOUNTER — Encounter: Payer: Self-pay | Admitting: Cardiology

## 2017-11-02 VITALS — BP 110/76 | HR 79 | Ht 63.5 in | Wt 159.0 lb

## 2017-11-02 DIAGNOSIS — R071 Chest pain on breathing: Secondary | ICD-10-CM | POA: Diagnosis not present

## 2017-11-02 DIAGNOSIS — R0789 Other chest pain: Secondary | ICD-10-CM

## 2017-11-02 NOTE — Patient Instructions (Signed)
Medication Instructions:  Your physician recommends that you continue on your current medications as directed. Please refer to the Current Medication list given to you today.  Labwork: None  Testing/Procedures: None  Follow-Up: Your physician recommends that you schedule a follow-up appointment as need if symptoms worsen or fail to improve.  Any Other Special Instructions Will Be Listed Below (If Applicable).     If you need a refill on your cardiac medications before your next appointment, please call your pharmacy.

## 2018-08-07 ENCOUNTER — Telehealth: Payer: Self-pay | Admitting: *Deleted

## 2018-08-07 NOTE — Telephone Encounter (Signed)
Pt wanted refill of Trazodone until can see new PCP on 9/12. I explained that Dr. Bettina Gavia cannot call in this med because it is out of his scope as a cardiologist.

## 2018-10-28 IMAGING — CT CT ANGIO CHEST
2 of 8 series · 19 of 36 positions shown · IV contrast (isovue)
Comparison: Chest x-ray of 09/13/2017

CLINICAL DATA: Fvm verbally of pulmonary embolism, chest pain

EXAM:
CT ANGIOGRAPHY CHEST WITH CONTRAST
TECHNIQUE: Multidetector CT imaging of the chest was performed using the
standard protocol during bolus administration of intravenous
contrast. Multiplanar CT image reconstructions and MIPs were
obtained to evaluate the vascular anatomy.
CONTRAST:  100 cc Isovue 3 7 the

[Series 6: pe coronal mpr · coronal · 0.65mm/px · 1 of 93 slices shown]
[im 47/93  mediastinal]
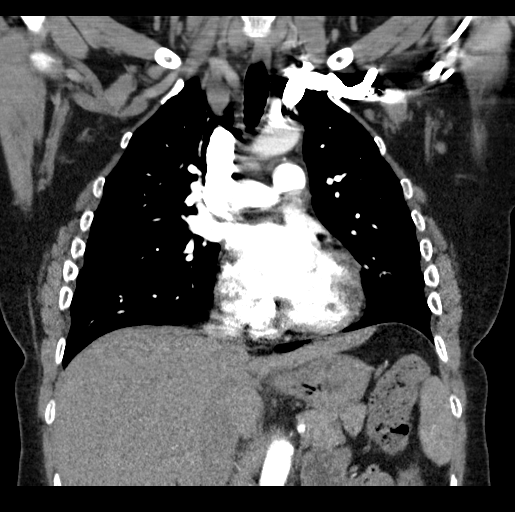

[Series 10: pe thins · axial · 0.59mm/px · z∈[-340,-66]mm · 18 of 308 slices shown]
[im 17/308  lung]
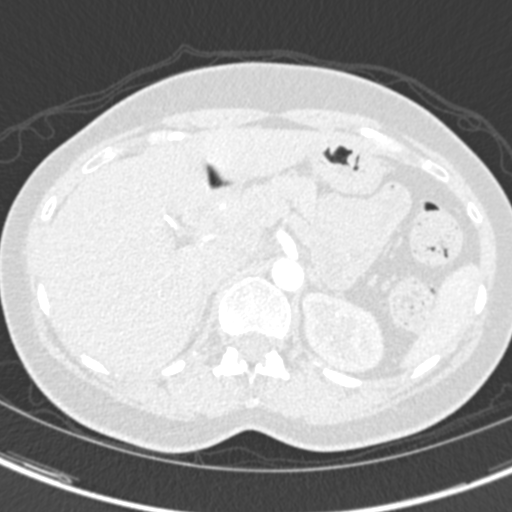
[im 33/308  mediastinal]
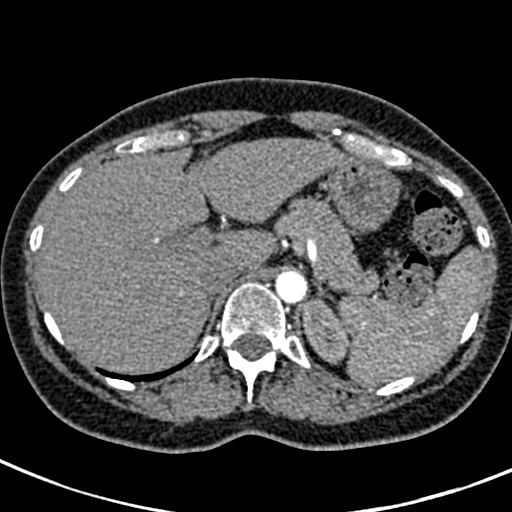
[im 49/308  lung]
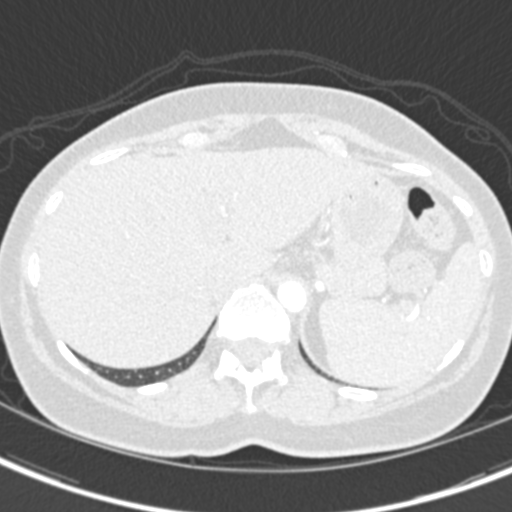
[im 65/308  mediastinal]
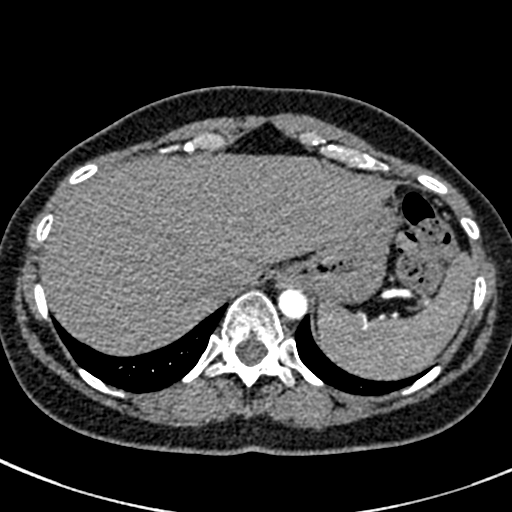
[im 81/308  lung]
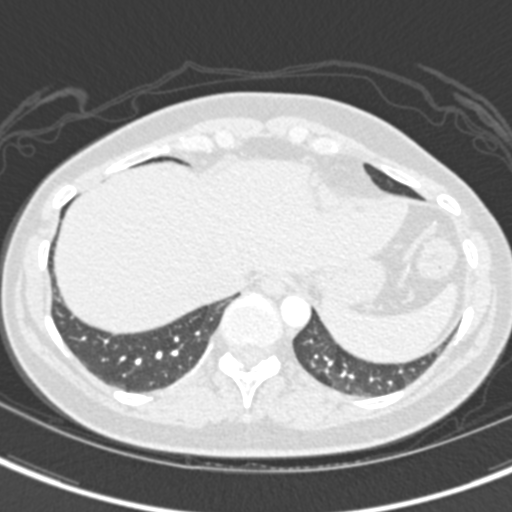
[im 97/308  mediastinal]
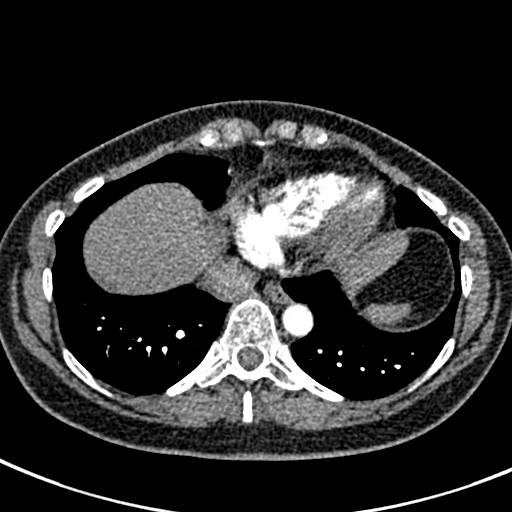
[im 114/308  lung]
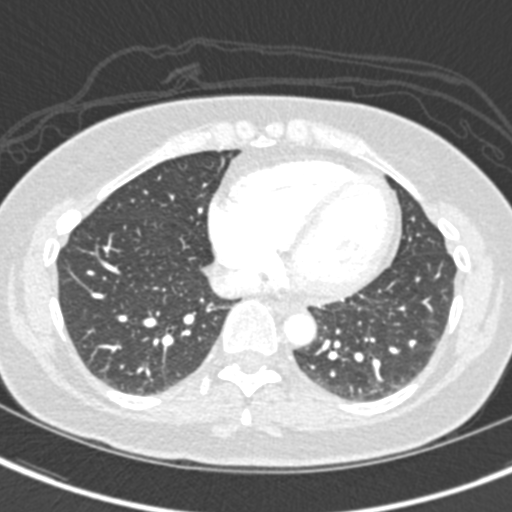
[im 130/308  mediastinal]
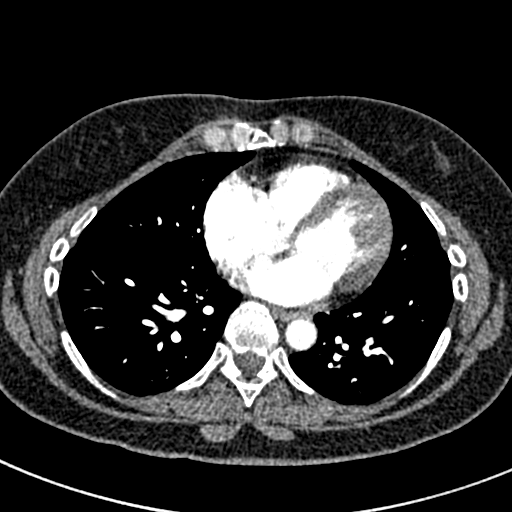
[im 146/308  lung]
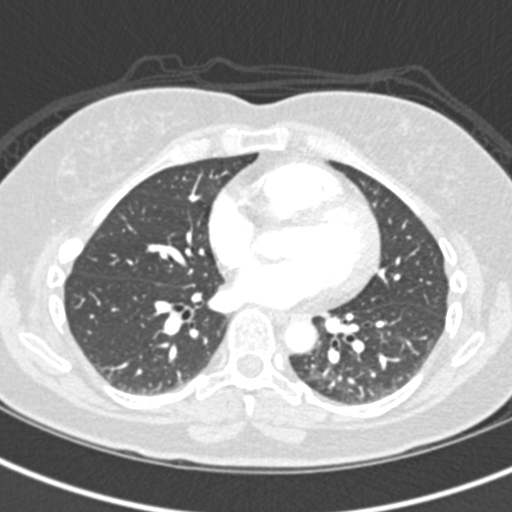
[im 162/308  mediastinal]
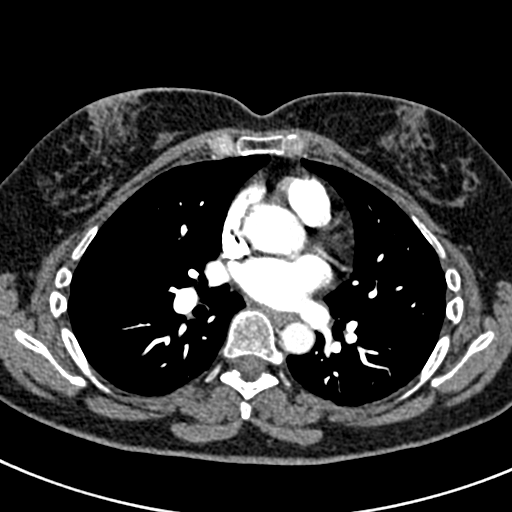
[im 178/308  lung]
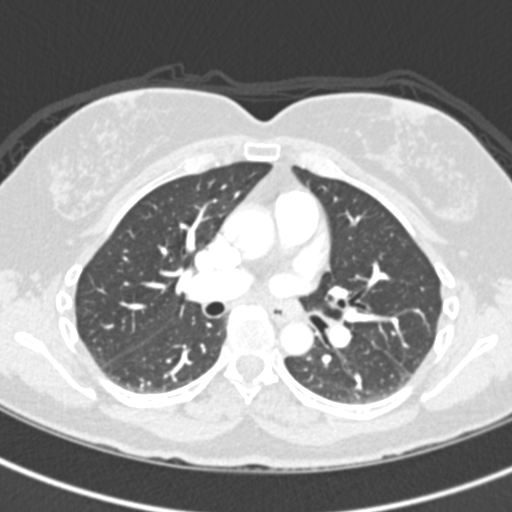
[im 194/308  mediastinal]
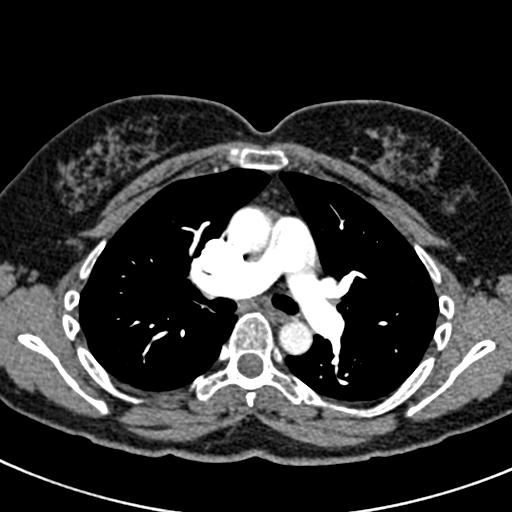
[im 211/308  lung]
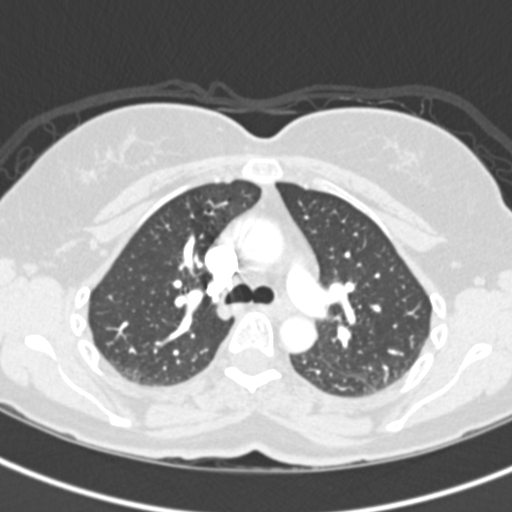
[im 227/308  mediastinal]
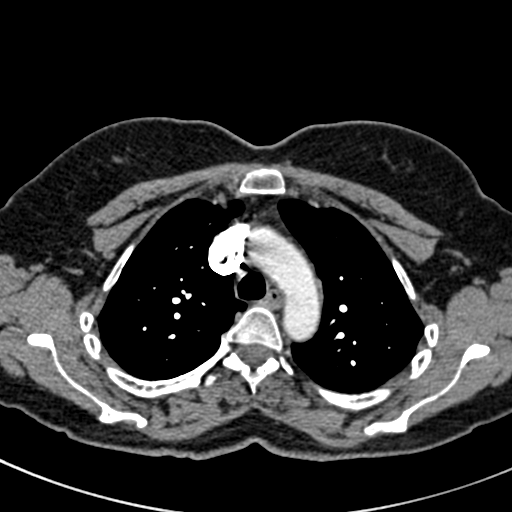
[im 243/308  lung]
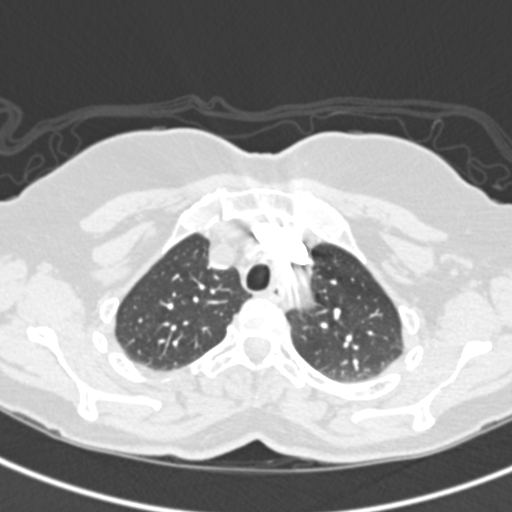
[im 259/308  mediastinal]
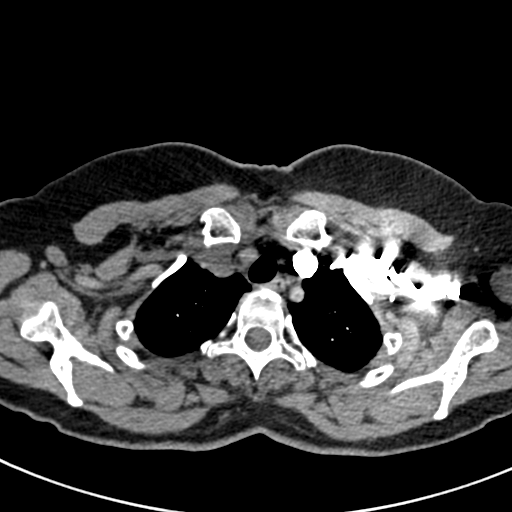
[im 275/308  lung]
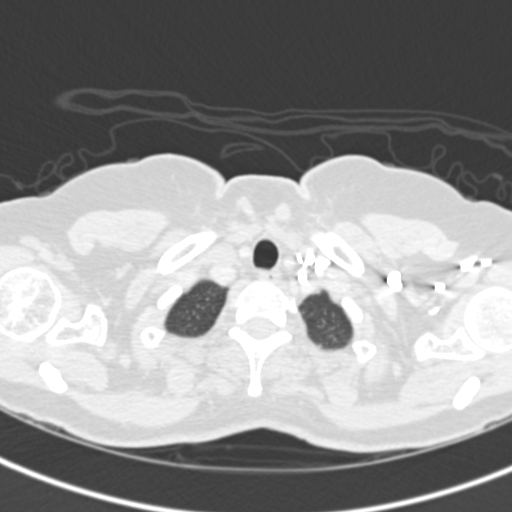
[im 291/308  mediastinal]
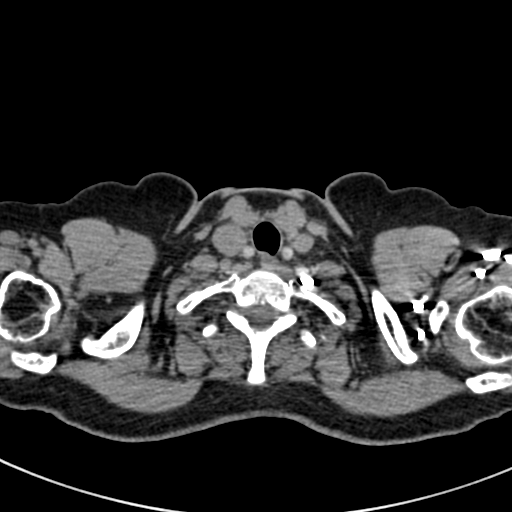

[19 of 36 positions shown; findings below may reference images not displayed]

FINDINGS: Cardiovascular: The pulmonary arteries are well opacified. There is
no evidence of acute pulmonary embolism. The thoracic aorta is not
as well opacified but no acute abnormality is seen. The mid
ascending thoracic aorta measures 25 mm in diameter. The heart is
mildly enlarged. No pericardial effusion is seen. No significant
coronary artery calcifications are noted.

Mediastinum/Nodes: There are a few small mediastinal lymph nodes,
but no mediastinal or hilar adenopathy is seen. The thyroid gland is
unremarkable.

Lungs/Pleura: On lung window images, no pneumonia is seen and there
is no evidence of pleural effusion. No pulmonary nodule is seen. The
central airway is patent

Upper Abdomen: The portion of the upper abdomen that is visualized
is unremarkable.

Musculoskeletal: Healed fractures of the bilateral anterior sixth
ribs are noted as well as a healed fracture of the anterolateral
right fifth.

Review of the MIP images confirms the above findings.
IMPRESSION: 1. No evidence of acute pulmonary embolism.
2. No acute abnormality of the thoracic aorta is seen.
3. No pneumonia or pleural effusion.
4. Healed fractures of the right fifth and bilateral anterior sixth
ribs.

## 2023-07-24 ENCOUNTER — Encounter: Payer: Self-pay | Admitting: *Deleted

## 2023-07-27 ENCOUNTER — Ambulatory Visit: Payer: BC Managed Care – PPO

## 2023-07-27 VITALS — BP 102/60 | HR 83 | Ht 63.0 in | Wt 164.2 lb

## 2023-07-27 DIAGNOSIS — R079 Chest pain, unspecified: Secondary | ICD-10-CM | POA: Insufficient documentation

## 2023-07-27 MED ORDER — METOPROLOL TARTRATE 50 MG PO TABS: 50.0000 mg | ORAL_TABLET | ORAL | 0 refills | Status: AC

## 2023-07-27 NOTE — Progress Notes (Signed)
Cardiology Consultation:    Date:  07/27/2023   ID:  Michele Roberson, DOB 15-Feb-1966, MRN [id number]  PCP:  Philemon Kingdom, MD  Cardiologist:  Marlyn Corporal Darienne Belleau, MD   Referring MD: Buckner Malta, MD   Chief Complaint  Patient presents with   Chest Pain    History of Present Illness:    Michele Roberson is a 57 y.o. female who is being seen today for the evaluation of chest pain at the request of Buckner Malta, MD.   Ms Michele Roberson 57 year old woman seen today for consult, referred by PCP for further evaluation of chest pain  Last visit in our office was November 2018 with Dr. Dulce Sellar. She was initially seen by him in October 2018 for chest pain which appeared musculoskeletal in description, stress echocardiogram at the time was reassuring with good exercise tolerance, no evidence of ischemia by EKG or echocardiography.  She has a history of depression, GERD, Graves disease in remission,  Very pleasant woman here for the visit by herself.  Works in data entry job in an office.  Denies any significant physical limitations.  Does not exercise.  She has been noticing symptoms of chest heaviness that last for minutes, occurring for past couple months, no specific trigger or relieving factors.  At times appears to radiate to the back.  No specific association with breathing, position, time of the day.  No associated symptoms such as nausea or vomiting.  Recently started taking minoxidil for hair thinning.  EKG today in the clinic shows sinus rhythm heart rate 83/min.  Normal PR interval and QRS duration.  Nonspecific ST-T wave abnormality in inferolateral leads.  Labs from 07/16/2023 hemoglobin 13.5, hematocrit 38, WBC 4.7, platelets 182 BUN 12, creatinine 0.8, normal transaminases, Hemoglobin A1c normal at 5.3 TSH normal 1.94 Last lipid panel from 07/16/2023 total cholesterol 240, HDL 78, LDL 133, triglycerides 144  Stress echocardiogram October 2018 normal with  86% of MPHR attained after exercising 7 minutes and 21 seconds, maximum 10.1 METS workload, negative EKG and echo portion for ischemia.  Normal study  Family history father had MI in his 108s.  Uncles also had MI in their 54s and 56s.   Past Medical History:  Diagnosis Date   Adjustment insomnia    Arthritis 09/18/2017   Arthropathy of right temporomandibular joint    Attention disturbance    Atypical chest pain    Chest pain 09/18/2017   Depression    Diverticulosis 09/18/2017   Endometriosis    Essential tremor    GERD without esophagitis    Goiter    Herpes labialis    Premature menopause    Thyroid disease     Past Surgical History:  Procedure Laterality Date   CHOLECYSTECTOMY  07/2010   PELVIC LAPAROSCOPY     x2   TONSILLECTOMY AND ADENOIDECTOMY     wrist cyst removal      Current Medications: Current Meds  Medication Sig   buPROPion (WELLBUTRIN XL) 150 MG 24 hr tablet Take 450 mg by mouth daily.   escitalopram (LEXAPRO) 20 MG tablet Take 20 mg by mouth daily.   ibuprofen (ADVIL) 200 MG tablet Take 400 mg by mouth daily as needed for mild pain or moderate pain.   Melatonin 10 MG CAPS Take 10 mg by mouth at bedtime.   minoxidil (LONITEN) 2.5 MG tablet Take 2.5 mg by mouth daily. Pt takes 1/2 tablet daily   propranolol (INDERAL) 10 MG tablet Take 10 mg by mouth  daily.   temazepam (RESTORIL) 15 MG capsule Take 15 mg by mouth at bedtime as needed for sleep.   traZODone (DESYREL) 50 MG tablet Take 50 mg by mouth at bedtime.     Allergies:   Erythromycin   Social History   Socioeconomic History   Marital status: Married    Spouse name: Not on file   Number of children: Not on file   Years of education: Not on file   Highest education level: Not on file  Occupational History   Not on file  Tobacco Use   Smoking status: Former   Smokeless tobacco: Never  Substance and Sexual Activity   Alcohol use: Not Currently    Comment: Rare, special occasion   Drug  use: No   Sexual activity: Yes    Partners: Male    Birth control/protection: Post-menopausal  Other Topics Concern   Not on file  Social History Narrative   Not on file   Social Determinants of Health   Financial Resource Strain: Not on file  Food Insecurity: Not on file  Transportation Needs: Not on file  Physical Activity: Not on file  Stress: Not on file  Social Connections: Not on file     Family History: The patient's family history includes Diabetes in her father and paternal grandmother; Heart attack in her father, maternal grandfather, and paternal grandmother; Heart disease in her father; Hypertension in her father and paternal grandmother; Stroke in her paternal grandmother; Uterine cancer in her paternal grandmother. ROS:   Please see the history of present illness.    All 14 point review of systems negative except as described per history of present illness.  EKGs/Labs/Other Studies Reviewed:    The following studies were reviewed today:   EKG:  EKG Interpretation Date/Time:  Friday July 27 2023 15:41:30 EDT Ventricular Rate:  83 PR Interval:  148 QRS Duration:  72 QT Interval:  354 QTC Calculation: 415 R Axis:   17  Text Interpretation: Normal sinus rhythm Low voltage QRS Nonspecific ST and T wave abnormality Abnormal ECG When compared with ECG of 25-Sep-2017 10:59, Criteria for Septal infarct are no longer Present Confirmed by Huntley Dec reddy [phone]) on 07/27/2023 3:46:44 PM    Recent Labs: No results found for requested labs within last 365 days.  Recent Lipid Panel No results found for: "CHOL", "TRIG", "HDL", "CHOLHDL", "VLDL", "LDLCALC", "LDLDIRECT"  Physical Exam:    VS:  BP 102/60 (BP Location: Right Arm, Patient Position: Sitting)   Pulse 83   Ht 5\' 3"  (1.6 m)   Wt 164 lb 3.2 oz (74.5 kg)   SpO2 93%   BMI 29.09 kg/m     Wt Readings from Last 3 Encounters:  07/27/23 164 lb 3.2 oz (74.5 kg)  07/16/23 163 lb (73.9 kg)  11/02/17  159 lb (72.1 kg)     GENERAL:  Well nourished, well developed in no acute distress NECK: No JVD; No carotid bruits CARDIAC: RRR, S1 and S2 present, no murmurs, no rubs, no gallops CHEST:  Clear to auscultation without rales, wheezing or rhonchi  Extremities: No pitting pedal edema. Pulses bilaterally symmetric with radial 2+ and dorsalis pedis 2+ NEUROLOGIC:  Alert and oriented x 3   ASSESSMENT AND PLAN:   58 year old woman with history of depression, GERD, Graves' disease currently in remission, sedentary lifestyle, family history of premature CAD presents with chest pressure and heaviness symptoms going on for few months.  Atypical symptoms. Problem List Items Addressed This Visit  Chest pain of uncertain etiology - Primary    Symptoms atypical, could potentially bring cardiac origin given her description, family history. Nonspecific EKG changes limits assessment with exercise stress test. Will proceed with CT coronary angiogram to assess for any underlying atherosclerotic disease of the coronaries.  Advised her to avoid any moderate to heavy exertion. She is aware to go to the nearest ER if symptoms acutely worsen or sustain for prolonged time.  Will order metoprolol tartrate 50 mg in the night before and 50 mg in the morning of the test to optimize heart rates for imaging.  Return to clinic based on test results.        Relevant Orders   EKG 12-Lead (Completed)     Medication Adjustments/Labs and Tests Ordered: Current medicines are reviewed at length with the patient today.  Concerns regarding medicines are outlined above.  Orders Placed This Encounter  Procedures   EKG 12-Lead   No orders of the defined types were placed in this encounter.   Signed, Cecille Amsterdam, MD, MPH, [facility]. 07/27/2023 4:15 PM    [facility]

## 2023-07-27 NOTE — Patient Instructions (Signed)
Medication Instructions:   Take: Metoprolol 50mg  in the evening prior to CT Scan and 50mg  in the morning prior to CT Scan  *If you need a refill on your cardiac medications before your next appointment, please call your pharmacy*   Lab Work: None needed If you have labs (blood work) drawn today and your tests are completely normal, you will receive your results only by: MyChart Message (if you have MyChart) OR A paper copy in the mail If you have any lab test that is abnormal or we need to change your treatment, we will call you to review the results.   Testing/Procedures: Your physician has requested that you have cardiac CT. Cardiac computed tomography (CT) is a painless test that uses an x-ray machine to take clear, detailed pictures of your heart. For further information please visit [email or web address] Please follow instruction sheet as given.    Your Cardiac CT will be scheduled at:   [facility] located off [facility] at the hospital.  Please arrive 30 minutes prior to your appointment time.  You can use the FREE valet parking offered at entrance to outpatient center (encouraged to control the heart rate for the test)   Please follow these instructions carefully (unless otherwise directed):    On the Night Before the Test: Be sure to Drink plenty of water. Do not consume any caffeinated/decaffeinated beverages or chocolate 12 hours prior to your test. Do not take any antihistamines 12 hours prior to your test.   On the Day of the Test: Drink plenty of water until 1 hour prior to the test. Do not eat any food 4 hours prior to the test. No smoking 4 hours prior to test. You may take your regular medications prior to the test.  Take metoprolol (Lopressor) two hours prior to test. FEMALES- please wear underwire-free bra if available, avoid dresses & tight clothing. Wear plain shirt no beads, sparkles, rhinestones, metal or heavy  embroidery.  After the Test: Drink plenty of water. After receiving IV contrast, you may experience a mild flushed feeling. This is normal. On occasion, you may experience a mild rash up to 24 hours after the test. This is not dangerous. If this occurs, you can take Benadryl 25 mg and increase your fluid intake. If you experience trouble breathing, this can be serious. If it is severe call 911 IMMEDIATELY. If it is mild, please call our office. If you take any of these medications: Glipizide/Metformin, Avandament, Glucavance, please do not take 48 hours after completing test unless otherwise instructed.  We will call to schedule your test 2-4 weeks out understanding that some insurance companies will need an authorization prior to the service being performed.      Follow-Up: At [facility], you and your health needs are our priority.  As part of our continuing mission to provide you with exceptional heart care, we have created designated Provider Care Teams.  These Care Teams include your primary Cardiologist (physician) and Advanced Practice Providers (APPs -  Physician Assistants and Nurse Practitioners) who all work together to provide you with the care you need, when you need it.  We recommend signing up for the patient portal called "MyChart".  Sign up information is provided on this After Visit Summary.  MyChart is used to connect with patients for Virtual Visits (Telemedicine).  Patients are able to view lab/test results, encounter notes, upcoming appointments, etc.  Non-urgent messages can be sent to your provider as well.  To learn more about what you can do with MyChart, go to [email or web address].    Your next appointment:   Depending on Test results  The format for your next appointment:     Provider:   Vern Claude Madireddy, MD    Other Instructions Cardiac CT Angiogram A cardiac CT angiogram is a procedure to look at the heart and the area around the heart. It  may be done to help find the cause of chest pains or other symptoms of heart disease. During this procedure, a substance called contrast dye is injected into the blood vessels in the area to be checked. A large X-ray machine, called a CT scanner, then takes detailed pictures of the heart and the surrounding area. The procedure is also sometimes called a coronary CT angiogram, coronary artery scanning, or CTA. A cardiac CT angiogram allows the health care provider to see how well blood is flowing to and from the heart. The health care provider will be able to see if there are any problems, such as: Blockage or narrowing of the coronary arteries in the heart. Fluid around the heart. Signs of weakness or disease in the muscles, valves, and tissues of the heart. Tell a health care provider about: Any allergies you have. This is especially important if you have had a previous allergic reaction to contrast dye. All medicines you are taking, including vitamins, herbs, eye drops, creams, and over-the-counter medicines. Any blood disorders you have. Any surgeries you have had. Any medical conditions you have. Whether you are pregnant or may be pregnant. Any anxiety disorders, chronic pain, or other conditions you have that may increase your stress or prevent you from lying still. What are the risks? Generally, this is a safe procedure. However, problems may occur, including: Bleeding. Infection. Allergic reactions to medicines or dyes. Damage to other structures or organs. Kidney damage from the contrast dye that is used. Increased risk of cancer from radiation exposure. This risk is low. Talk with your health care provider about: The risks and benefits of testing. How you can receive the lowest dose of radiation. What happens before the procedure? Wear comfortable clothing and remove any jewelry, glasses, dentures, and hearing aids. Follow instructions from your health care provider about eating and  drinking. This may include: For 12 hours before the procedure -- avoid caffeine. This includes tea, coffee, soda, energy drinks, and diet pills. Drink plenty of water or other fluids that do not have caffeine in them. Being well hydrated can prevent complications. For 4-6 hours before the procedure -- stop eating and drinking. The contrast dye can cause nausea, but this is less likely if your stomach is empty. Ask your health care provider about changing or stopping your regular medicines. This is especially important if you are taking diabetes medicines, blood thinners, or medicines to treat problems with erections (erectile dysfunction). What happens during the procedure?  Hair on your chest may need to be removed so that small sticky patches called electrodes can be placed on your chest. These will transmit information that helps to monitor your heart during the procedure. An IV will be inserted into one of your veins. You might be given a medicine to control your heart rate during the procedure. This will help to ensure that good images are obtained. You will be asked to lie on an exam table. This table will slide in and out of the CT machine during the procedure. Contrast dye will be injected into the IV. You might  feel warm, or you may get a metallic taste in your mouth. You will be given a medicine called nitroglycerin. This will relax or dilate the arteries in your heart. The table that you are lying on will move into the CT machine tunnel for the scan. The person running the machine will give you instructions while the scans are being done. You may be asked to: Keep your arms above your head. Hold your breath. Stay very still, even if the table is moving. When the scanning is complete, you will be moved out of the machine. The IV will be removed. The procedure may vary among health care providers and hospitals. What can I expect after the procedure? After your procedure, it is common to  have: A metallic taste in your mouth from the contrast dye. A feeling of warmth. A headache from the nitroglycerin. Follow these instructions at home: Take over-the-counter and prescription medicines only as told by your health care provider. If you are told, drink enough fluid to keep your urine pale yellow. This will help to flush the contrast dye out of your body. Most people can return to their normal activities right after the procedure. Ask your health care provider what activities are safe for you. It is up to you to get the results of your procedure. Ask your health care provider, or the department that is doing the procedure, when your results will be ready. Keep all follow-up visits as told by your health care provider. This is important. Contact a health care provider if: You have any symptoms of allergy to the contrast dye. These include: Shortness of breath. Rash or hives. A racing heartbeat. Summary A cardiac CT angiogram is a procedure to look at the heart and the area around the heart. It may be done to help find the cause of chest pains or other symptoms of heart disease. During this procedure, a large X-ray machine, called a CT scanner, takes detailed pictures of the heart and the surrounding area after a contrast dye has been injected into blood vessels in the area. Ask your health care provider about changing or stopping your regular medicines before the procedure. This is especially important if you are taking diabetes medicines, blood thinners, or medicines to treat erectile dysfunction. If you are told, drink enough fluid to keep your urine pale yellow. This will help to flush the contrast dye out of your body. This information is not intended to replace advice given to you by your health care provider. Make sure you discuss any questions you have with your health care provider. Document Revised: 03/09/2022 Document Reviewed: 07/16/2019 Elsevier Patient Education  2023  Elsevier Inc.   Important Information About Sugar

## 2023-07-27 NOTE — Assessment & Plan Note (Signed)
Symptoms atypical, could potentially bring cardiac origin given her description, family history. Nonspecific EKG changes limits assessment with exercise stress test. Will proceed with CT coronary angiogram to assess for any underlying atherosclerotic disease of the coronaries.  Advised her to avoid any moderate to heavy exertion. She is aware to go to the nearest ER if symptoms acutely worsen or sustain for prolonged time.  Will order metoprolol tartrate 50 mg in the night before and 50 mg in the morning of the test to optimize heart rates for imaging.  Return to clinic based on test results.

## 2023-08-16 ENCOUNTER — Telehealth: Payer: Self-pay

## 2023-08-16 NOTE — Telephone Encounter (Signed)
Pt requesting that location of CT be changed to [facility]. She would like a callback regarding this matter. Please advise

## 2023-08-16 NOTE — Telephone Encounter (Signed)
LVM to call regarding message left by pt

## 2023-08-17 ENCOUNTER — Telehealth: Payer: Self-pay

## 2023-08-17 DIAGNOSIS — R072 Precordial pain: Secondary | ICD-10-CM

## 2023-08-17 NOTE — Telephone Encounter (Signed)
Pt would like to get CT angio at [facility]. Will print Instructions and leave up front for her to pick up.    Your cardiac CT will be scheduled at one of the below locations:   [facility] [address] [place], [place] [address] [phone]    If scheduled at [facility], please arrive at the [facility] and Children's Entrance (Entrance C2) of [facility] 30 minutes prior to test start time. You can use the FREE valet parking offered at entrance C (encouraged to control the heart rate for the test)  Proceed to the [facility] Radiology Department (first floor) to check-in and test prep.  All radiology patients and guests should use entrance C2 at [facility], accessed from [facility], even though the hospital's physical address listed is [address]     Please follow these instructions carefully (unless otherwise directed):    On the Night Before the Test: Be sure to Drink plenty of water. Do not consume any caffeinated/decaffeinated beverages or chocolate 12 hours prior to your test. Do not take any antihistamines 12 hours prior to your test.   On the Day of the Test: Drink plenty of water until 1 hour prior to the test. Do not eat any food 4 hours prior to the test. You may take your regular medications prior to the test.  Take metoprolol (Lopressor) 50mg  at bedtme the night before procedure -50mg  the morning of procedure HOLD Furosemide/Hydrochlorothiazide morning of the test. FEMALES- please wear underwire-free bra if available, avoid dresses & tight clothing          After the Test: Drink plenty of water. After receiving IV contrast, you may experience a mild flushed feeling. This is normal. On occasion, you may experience a mild rash up to 24 hours after the test. This is not dangerous. If this occurs, you can take Benadryl 25 mg and increase your fluid intake. If you experience trouble breathing, this  can be serious. If it is severe call 911 IMMEDIATELY. If it is mild, please call our office. If you take any of these medications: Glipizide/Metformin, Avandament, Glucavance, please do not take 48 hours after completing test unless otherwise instructed.  We will call to schedule your test 2-4 weeks out understanding that some insurance companies will need an authorization prior to the service being performed.   For non-scheduling related questions, please contact the cardiac imaging nurse navigator should you have any questions/concerns: Rockwell Alexandria, Cardiac Imaging Nurse Navigator Larey Brick, Cardiac Imaging Nurse Navigator [facility] Direct Office Dial: [phone]   For scheduling needs, including cancellations and rescheduling, please call [place], [phone].

## 2023-08-17 NOTE — Telephone Encounter (Signed)
Pt was returning nurse call and is requesting a callback. Please advise.

## 2023-08-31 ENCOUNTER — Telehealth (HOSPITAL_COMMUNITY): Payer: Self-pay | Admitting: *Deleted

## 2023-08-31 NOTE — Telephone Encounter (Signed)
Reaching out to patient to offer assistance regarding upcoming cardiac imaging study; pt verbalizes understanding of appt date/time, parking situation and where to check in, pre-test NPO status and medications ordered, and verified current allergies; name and call back number provided for further questions should they arise Hayley Sharpe RN Navigator Cardiac Imaging [facility] [phone] office [phone] cell  

## 2023-09-03 ENCOUNTER — Encounter (HOSPITAL_COMMUNITY): Payer: Self-pay

## 2023-09-03 ENCOUNTER — Ambulatory Visit (HOSPITAL_COMMUNITY)
Admission: RE | Admit: 2023-09-03 | Discharge: 2023-09-03 | Disposition: A | Discharge status: Home / Self Care | Payer: BC Managed Care – PPO | Source: Ambulatory Visit

## 2023-09-03 DIAGNOSIS — R072 Precordial pain: Secondary | ICD-10-CM | POA: Diagnosis present

## 2023-09-03 MED ORDER — NITROGLYCERIN 0.4 MG SL SUBL
0.8000 mg | SUBLINGUAL_TABLET | Freq: Once | SUBLINGUAL | Status: AC
  Administered 2023-09-03: 0.8 mg via SUBLINGUAL

## 2023-09-03 MED ORDER — IOHEXOL 350 MG/ML SOLN
95.0000 mL | Freq: Once | INTRAVENOUS | Status: AC | PRN
  Administered 2023-09-03: 95 mL via INTRAVENOUS

## 2023-09-03 MED ORDER — NITROGLYCERIN 0.4 MG SL SUBL
SUBLINGUAL_TABLET | SUBLINGUAL | Status: AC
  Filled 2023-09-03: qty 2

## 2024-08-16 ENCOUNTER — Ambulatory Visit (HOSPITAL_BASED_OUTPATIENT_CLINIC_OR_DEPARTMENT_OTHER): Admission: EM | Admit: 2024-08-16 | Discharge: 2024-08-16 | Disposition: A | Discharge status: Home / Self Care

## 2024-08-16 ENCOUNTER — Ambulatory Visit (INDEPENDENT_AMBULATORY_CARE_PROVIDER_SITE_OTHER): Admit: 2024-08-16 | Discharge: 2024-08-16 | Disposition: A | Discharge status: Home / Self Care | Admitting: Radiology

## 2024-08-16 ENCOUNTER — Encounter (HOSPITAL_BASED_OUTPATIENT_CLINIC_OR_DEPARTMENT_OTHER): Payer: Self-pay

## 2024-08-16 DIAGNOSIS — M533 Sacrococcygeal disorders, not elsewhere classified: Secondary | ICD-10-CM | POA: Diagnosis not present

## 2024-08-16 NOTE — ED Triage Notes (Signed)
 States I threw my back out on Wednesday. Reports on Thursday, was going up approx 2 steps and felt sharp pain to back which caused her to fall backwards. Patient hit floor on tailbone. Having pain to tailbone area. No loss of bowel or bladder control. Struck back of head. No LOC. Denies blurred vision or nausea. Extreme pain when rising to stand and initially when starting to walk. Has not had anything for pain today.

## 2024-08-16 NOTE — Discharge Instructions (Signed)
 For pain relief I recommend doing 600 mg of ibuprofen every 8 hours along with 2 extra strength Tylenol or 1000 mg of Tylenol every 8 hours.  You could also do lidocaine patches over-the-counter or topical rubs to the area. Alternating heat and ice to the area. You may want to purchase a soft cushion to sit on you can get these online on Amazon or over-the-counter at light [employer] or [employer] Follow-up with your doctor as needed

## 2024-08-16 NOTE — ED Provider Notes (Signed)
 [facility]    CSN: [id number] Arrival date & time: 08/16/24  0919      History   Chief Complaint Chief Complaint  Patient presents with   Back Pain    HPI Michele Roberson is a 58 y.o. female.   States I threw my back out on Wednesday. Reports on Thursday, was going up approx 2 steps and felt sharp pain to back which caused her to fall backwards. Patient hit floor on tailbone. Having pain to tailbone area. No loss of bowel or bladder control. Struck back of head. No LOC. Denies blurred vision or nausea. Extreme pain when rising to stand and initially when starting to walk. Has not had anything for pain today.    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/18/2017   Arthropathy of right temporomandibular joint    Attention disturbance    Atypical chest pain    Chest pain 09/18/2017   Depression    Diverticulosis 09/18/2017   Endometriosis    Essential tremor    GERD without esophagitis    Goiter    Herpes labialis    Premature menopause    Thyroid disease     Patient Active Problem List   Diagnosis Date Noted   Chest pain of uncertain etiology 07/27/2023   SOB (shortness of breath) on exertion 09/19/2017   Costochondral chest pain 09/19/2017   Pleuritic chest pain 09/18/2017   Diverticulosis 09/18/2017   Arthritis 09/18/2017   Thyroid disease    Endometriosis    Depression    Premature menopause     Past Surgical History:  Procedure Laterality Date   CHOLECYSTECTOMY  07/2010   PELVIC LAPAROSCOPY     x2   TONSILLECTOMY AND ADENOIDECTOMY     wrist cyst removal      OB History     Gravida  0   Para      Term      Preterm      AB      Living         SAB      IAB      Ectopic      Multiple      Live Births               Home Medications    Prior to Admission medications   Medication Sig Start Date End Date Taking? Authorizing Provider  medroxyPROGESTERone  (PROVERA) 5 MG tablet Take 5 mg by mouth daily. 08/05/24  Yes [provider]  buPROPion (WELLBUTRIN XL) 150 MG 24 hr tablet Take 450 mg by mouth daily.    [provider]  escitalopram (LEXAPRO) 20 MG tablet Take 20 mg by mouth daily.    [provider]  ibuprofen (ADVIL) 200 MG tablet Take 400 mg by mouth daily as needed for mild pain or moderate pain.    [provider]  Melatonin 10 MG CAPS Take 10 mg by mouth at bedtime.    [provider]  metoprolol  tartrate (LOPRESSOR ) 50 MG tablet Take 1 tablet (50 mg total) by mouth as directed. Take 1 tablet in the evening before the CT scan and 1 tablet in the morning prior to the CT scan 07/27/23 10/25/23  Madireddy, Alean SAUNDERS, MD  minoxidil (LONITEN) 2.5 MG tablet Take 2.5 mg by mouth daily. Pt takes 1/2 tablet daily    [provider]  propranolol (INDERAL) 10 MG  tablet Take 10 mg by mouth daily. 10/10/22   [provider]  temazepam (RESTORIL) 15 MG capsule Take 15 mg by mouth at bedtime as needed for sleep.    [provider]  traZODone (DESYREL) 50 MG tablet Take 50 mg by mouth at bedtime. 12/05/22   [provider]    Family History Family History  Problem Relation Age of Onset   Diabetes Father    Hypertension Father    Heart disease Father    Heart attack Father    Heart attack Maternal Grandfather    Uterine cancer Paternal Grandmother    Hypertension Paternal Grandmother    Diabetes Paternal Grandmother    Heart attack Paternal Grandmother    Stroke Paternal Grandmother     Social History Social History   Tobacco Use   Smoking status: Former   Smokeless tobacco: Never  Substance Use Topics   Alcohol use: Not Currently    Comment: Rare, special occasion   Drug use: No     Allergies   Erythromycin   Review of Systems Review of Systems  Musculoskeletal:  Positive for back pain.     Physical Exam Triage Vital Signs ED Triage Vitals  [08/16/24 0955]  Encounter Vitals Group     BP 97/60     Girls Systolic BP Percentile      Girls Diastolic BP Percentile      Boys Systolic BP Percentile      Boys Diastolic BP Percentile      Pulse Rate 84     Resp 20     Temp 98.3 F (36.8 C)     Temp Source Oral     SpO2 98 %     Weight      Height      Head Circumference      Peak Flow      Pain Score 10     Pain Loc      Pain Education      Exclude from Growth Chart    No data found.  Updated Vital Signs BP 97/60 (BP Location: Left Arm)   Pulse 84   Temp 98.3 F (36.8 C) (Oral)   Resp 20   SpO2 98%   Visual Acuity Right Eye Distance:   Left Eye Distance:   Bilateral Distance:    Right Eye Near:   Left Eye Near:    Bilateral Near:     Physical Exam   UC Treatments / Results  Labs (all labs ordered are listed, but only abnormal results are displayed) Labs Reviewed - No data to display  EKG   Radiology No results found.  Procedures Procedures (including critical care time)  Medications Ordered in UC Medications - No data to display  Initial Impression / Assessment and Plan / UC Course  I have reviewed the triage vital signs and the nursing notes.  Pertinent labs & imaging results that were available during my care of the patient were reviewed by me and considered in my medical decision making (see chart for details).     *** Final Clinical Impressions(s) / UC Diagnoses   Final diagnoses:  None   Discharge Instructions   None    ED Prescriptions   None    PDMP not reviewed this encounter.

## 2024-08-18 ENCOUNTER — Ambulatory Visit: Payer: Self-pay
